# Patient Record
Sex: Female | Born: 1945 | Hispanic: No | Marital: Married | Smoking: Never smoker
Health system: Southern US, Community
[De-identification: ages and names within clinical notes are randomized; demographics above are authoritative.]

## PROBLEM LIST (undated history)

## (undated) HISTORY — PX: CHOLECYSTECTOMY: SHX55

## (undated) HISTORY — PX: KNEE SURGERY: SHX244

## (undated) HISTORY — PX: TUBAL LIGATION: SHX77

---

## 2005-06-01 ENCOUNTER — Ambulatory Visit: Payer: Self-pay | Admitting: Internal Medicine

## 2007-05-11 ENCOUNTER — Ambulatory Visit: Payer: Self-pay | Admitting: Family Medicine

## 2008-04-29 ENCOUNTER — Encounter: Payer: Self-pay | Admitting: Orthopedic Surgery

## 2008-05-12 ENCOUNTER — Encounter: Payer: Self-pay | Admitting: Orthopedic Surgery

## 2008-05-22 ENCOUNTER — Ambulatory Visit: Payer: Self-pay | Admitting: Orthopedic Surgery

## 2008-06-10 ENCOUNTER — Ambulatory Visit: Payer: Self-pay | Admitting: Orthopedic Surgery

## 2009-09-24 ENCOUNTER — Ambulatory Visit: Payer: Self-pay | Admitting: Obstetrics and Gynecology

## 2009-11-06 ENCOUNTER — Ambulatory Visit: Payer: Self-pay | Admitting: Unknown Physician Specialty

## 2011-10-21 ENCOUNTER — Ambulatory Visit: Payer: Self-pay | Admitting: Family Medicine

## 2011-11-10 ENCOUNTER — Ambulatory Visit: Payer: Self-pay | Admitting: Family Medicine

## 2011-11-23 ENCOUNTER — Ambulatory Visit: Payer: Self-pay | Admitting: Obstetrics and Gynecology

## 2012-09-18 IMAGING — MG MM MAMMO DIAGNOSTIC UNILATERAL*R*
1 series · 8 of 8 positions shown · non-contrast
Comparison: none

REASON FOR EXAM: RT BRST PAIN LATERAL ASPECT
COMMENTS:

PROCEDURE:     MAM - MAM DGTL UNI MAM RT BREAST W/CAD  - November 23, 2011  [DATE]
RESULT:
COMPARISON STUDY:    10/21/2011 and 09/24/2009.

[R CC · right · 8 of 11 slices shown]
[im 1/11]
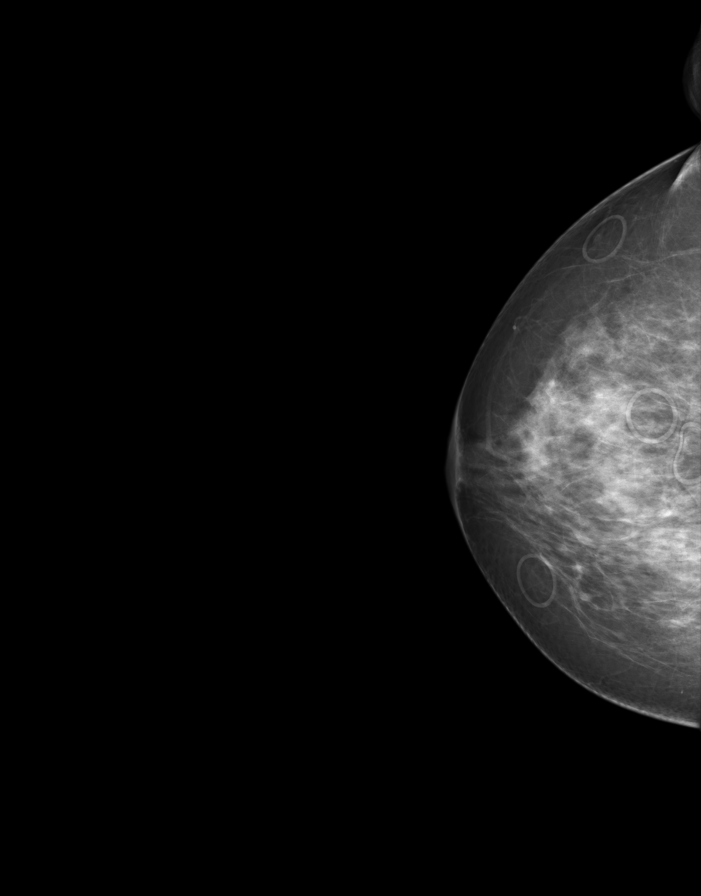
[im 2/11]
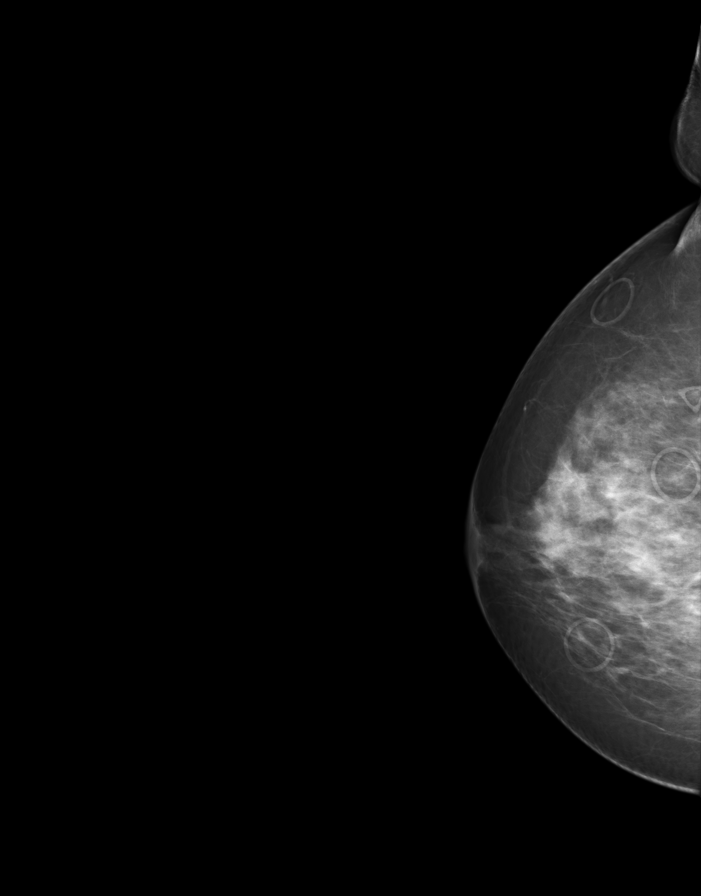
[im 3/11]
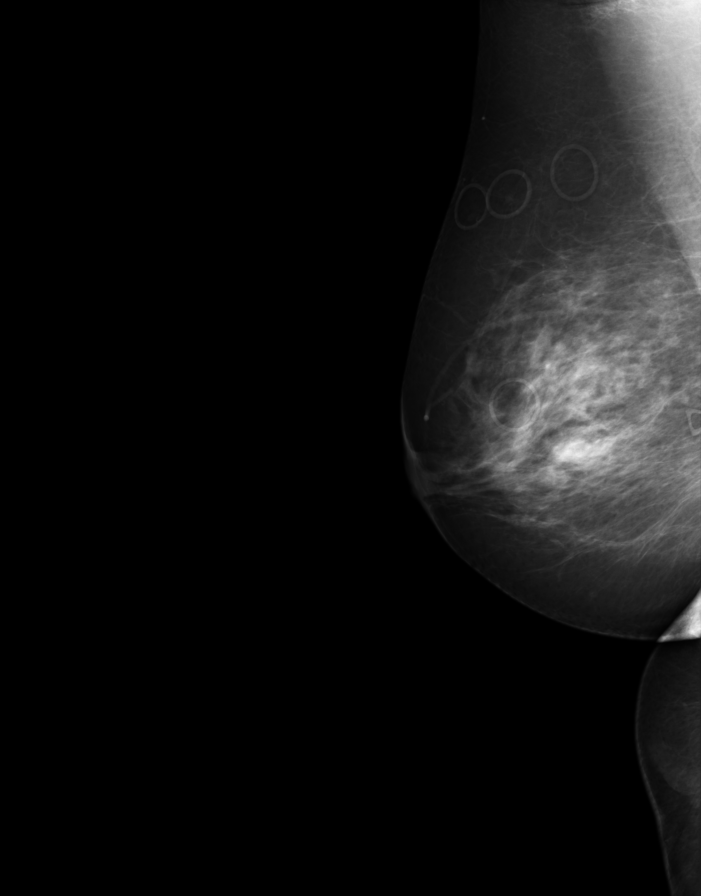
[im 5/11]
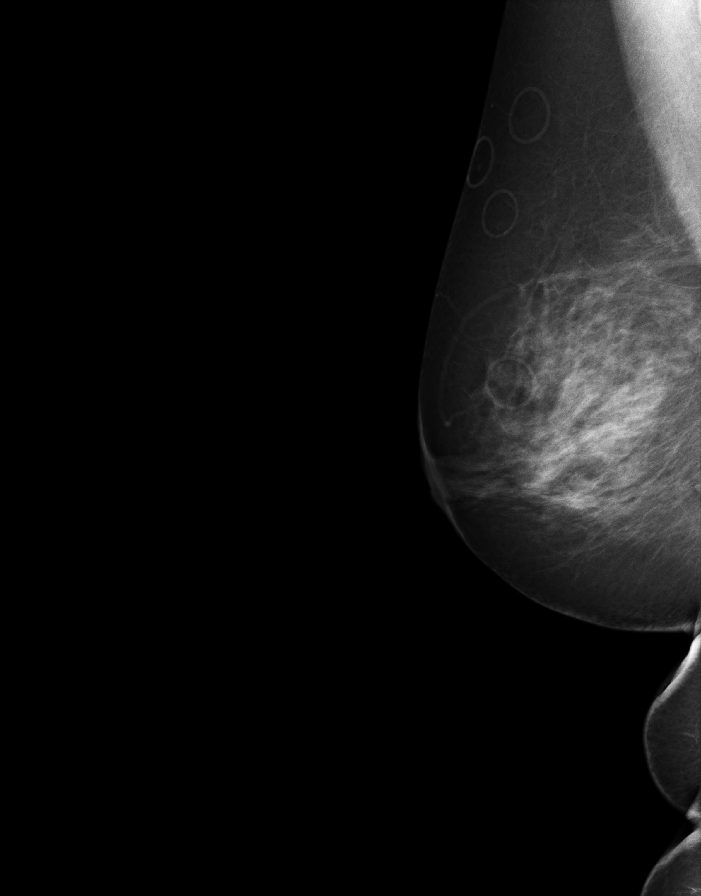
[im 6/11]
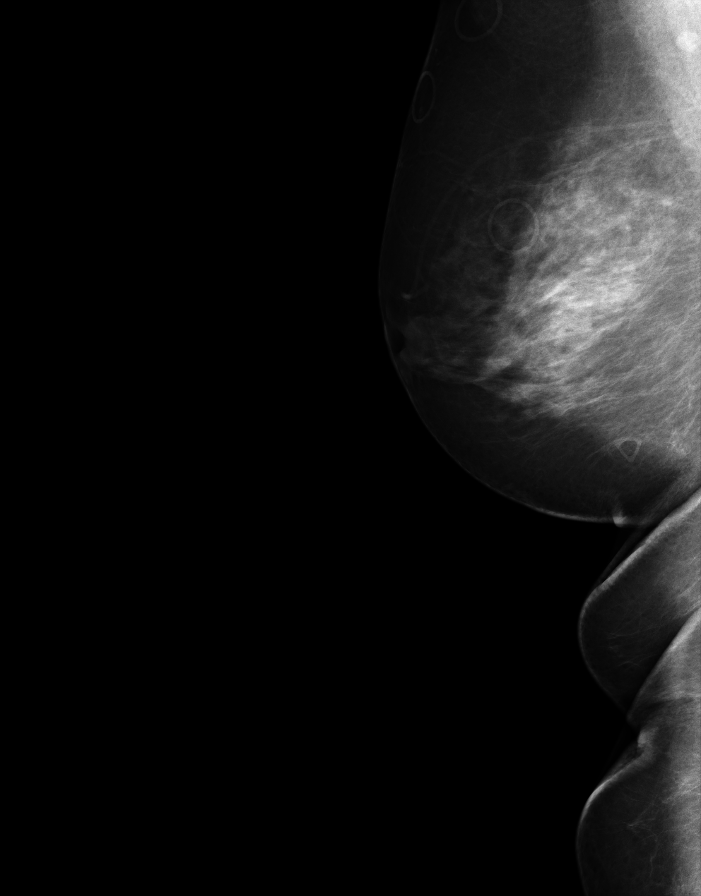
[im 8/11]
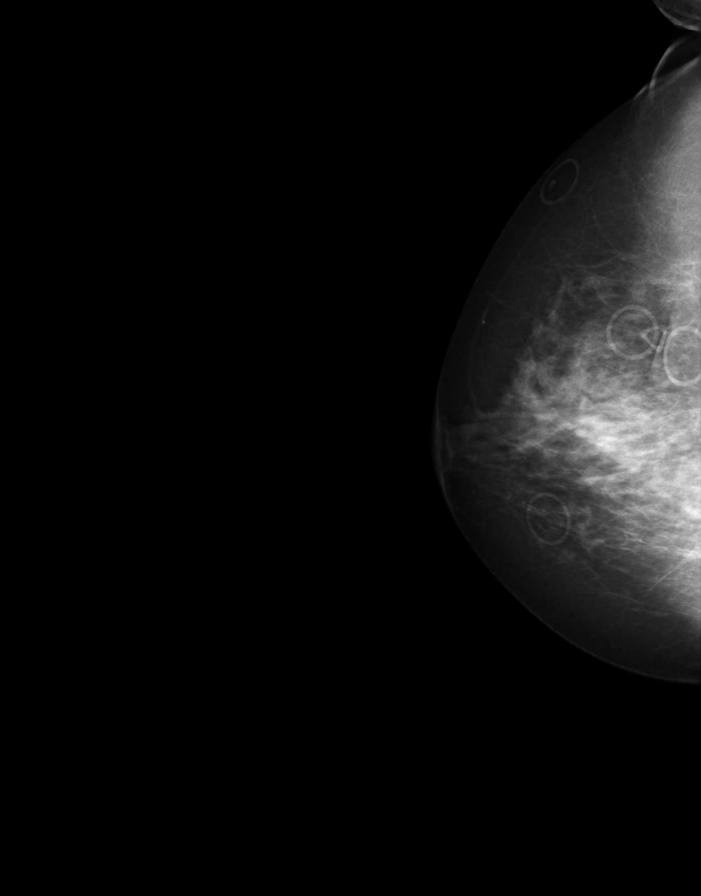
[im 9/11]
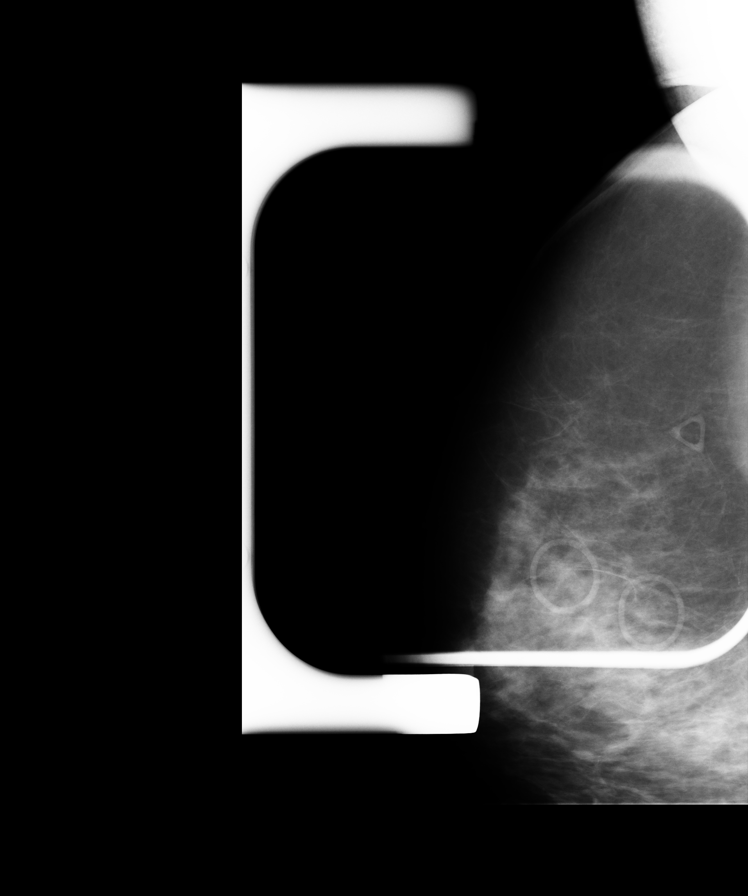
[im 11/11]
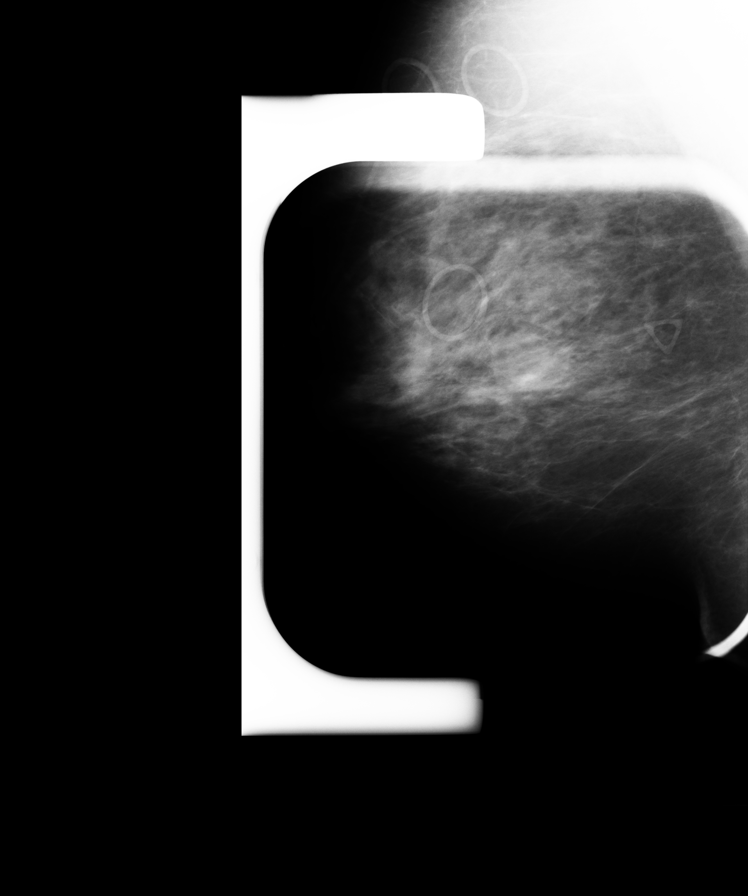

[8 of 8 positions shown; findings below may reference images not displayed]

FINDINGS: Diagnostic mammograms including spot compression magnification
views were performed of the right breast secondary to pain in the lateral
aspect of the right breast. A marker was placed along the inferolateral
right breast at approximately 7-8 o'clock in the region of the patient's
pain directed by the patient. Spot compression magnification views of this
region showed no mass or focal asymmetry. There was a small asymmetry more
anteriorly in the right MLO view. However, with the spot compression
magnification view, this asymmetry effaced and assumed the appearance of
normal glandular tissue. Additionally, no abnormality is seen on the CC or
true lateral views.

Real-time ultrasound was performed of the region of the patient's pain in
the lateral right breast, which was located approximately 7 o'clock. No mass
or suspicious shadowing was identified.
IMPRESSION: 1.     BI-RADS: Category 1-Negative.
2.     Recommend continued annual screening mammography. Recommend further
evaluation and management of the patient's breast pain be based on clinical
grounds.

Thank you for the opportunity to contribute to the care of your patient.

A negative mammogram report does not preclude biopsy or other evaluation of
a clinically palpable or otherwise suspicious mass or lesion.  Breast cancer
may not be detected by mammography in up to 10% of cases.

## 2012-12-13 ENCOUNTER — Ambulatory Visit: Payer: Self-pay | Admitting: Internal Medicine

## 2013-01-16 ENCOUNTER — Ambulatory Visit: Payer: Self-pay | Admitting: Family Medicine

## 2015-05-02 ENCOUNTER — Ambulatory Visit
Admission: EM | Admit: 2015-05-02 | Discharge: 2015-05-02 | Disposition: A | Discharge status: Home / Self Care | Payer: Medicare Other | Attending: Family Medicine | Admitting: Family Medicine

## 2015-05-02 DIAGNOSIS — J111 Influenza due to unidentified influenza virus with other respiratory manifestations: Secondary | ICD-10-CM | POA: Diagnosis not present

## 2015-05-02 LAB — RAPID INFLUENZA A&B ANTIGENS: Influenza A (ARMC): DETECTED

## 2015-05-02 LAB — RAPID INFLUENZA A&B ANTIGENS (ARMC ONLY): INFLUENZA B (ARMC): NOT DETECTED

## 2015-05-02 MED ORDER — MELOXICAM 15 MG PO TABS: 15.0000 mg | ORAL_TABLET | Freq: Every day | ORAL | Status: AC | PRN

## 2015-05-02 MED ORDER — FEXOFENADINE-PSEUDOEPHED ER 180-240 MG PO TB24: 1.0000 | ORAL_TABLET | Freq: Every day | ORAL | Status: AC

## 2015-05-02 MED ORDER — HYDROCOD POLST-CPM POLST ER 10-8 MG/5ML PO SUER: 5.0000 mL | Freq: Two times a day (BID) | ORAL | Status: AC | PRN

## 2015-05-02 MED ORDER — OSELTAMIVIR PHOSPHATE 75 MG PO CAPS: 75.0000 mg | ORAL_CAPSULE | Freq: Two times a day (BID) | ORAL | Status: AC

## 2015-05-02 NOTE — Discharge Instructions (Signed)
Influenza, Adult Influenza (flu) is an infection in the mouth, nose, and throat (respiratory tract) caused by a virus. The flu can make you feel very ill. Influenza spreads easily from person to person (contagious).  HOME CARE   Only take medicines as told by your doctor.  Use a cool mist humidifier to make breathing easier.  Get plenty of rest until your fever goes away. This usually takes 3 to 4 days.  Drink enough fluids to keep your pee (urine) clear or pale yellow.  Cover your mouth and nose when you cough or sneeze.  Wash your hands well to avoid spreading the flu.  Stay home from work or school until your fever has been gone for at least 1 full day.  Get a flu shot every year. GET HELP RIGHT AWAY IF:   You have trouble breathing or feel short of breath.  Your skin or nails turn blue.  You have severe neck pain or stiffness.  You have a severe headache, facial pain, or earache.  Your fever gets worse or keeps coming back.  You feel sick to your stomach (nauseous), throw up (vomit), or have watery poop (diarrhea).  You have chest pain.  You have a deep cough that gets worse, or you cough up more thick spit (mucus). MAKE SURE YOU:   Understand these instructions.  Will watch your condition.  Will get help right away if you are not doing well or get worse.   This information is not intended to replace advice given to you by your health care provider. Make sure you discuss any questions you have with your health care provider.   Document Released: 12/08/2007 Document Revised: 03/21/2014 Document Reviewed: 05/30/2011 Elsevier Interactive Patient Education 2016 [employer]  Cough, Adult A cough helps to clear your throat and lungs. A cough may last only 2-3 weeks (acute), or it may last longer than 8 weeks (chronic). Many different things can cause a cough. A cough may be a sign of an illness or another medical condition. HOME CARE  Pay attention to any changes  in your cough.  Take medicines only as told by your doctor.  If you were prescribed an antibiotic medicine, take it as told by your doctor. Do not stop taking it even if you start to feel better.  Talk with your doctor before you try using a cough medicine.  Drink enough fluid to keep your pee (urine) clear or pale yellow.  If the air is dry, use a cold steam vaporizer or humidifier in your home.  Stay away from things that make you cough at work or at home.  If your cough is worse at night, try using extra pillows to raise your head up higher while you sleep.  Do not smoke, and try not to be around smoke. If you need help quitting, ask your doctor.  Do not have caffeine.  Do not drink alcohol.  Rest as needed. GET HELP IF:  You have new problems (symptoms).  You cough up yellow fluid (pus).  Your cough does not get better after 2-3 weeks, or your cough gets worse.  Medicine does not help your cough and you are not sleeping well.  You have pain that gets worse or pain that is not helped with medicine.  You have a fever.  You are losing weight and you do not know why.  You have night sweats. GET HELP RIGHT AWAY IF:  You cough up blood.  You have trouble  breathing.  Your heartbeat is very fast.   This information is not intended to replace advice given to you by your health care provider. Make sure you discuss any questions you have with your health care provider.   Document Released: 11/11/2010 Document Revised: 11/19/2014 Document Reviewed: 05/07/2014 Elsevier Interactive Patient Education [employer]

## 2015-05-02 NOTE — ED Notes (Signed)
Patient complains of fever, chills, headaches, cough-dry, nasal drainage. Patient states that her symptoms started suddenly on Thursday.

## 2015-05-02 NOTE — ED Provider Notes (Signed)
CSN: [id number]     Arrival date & time 05/02/15  1003 History   First MD Initiated Contact with Patient 05/02/15 1354      Nurses notes were reviewed. Chief Complaint  Patient presents with  . Fever   patient reports coughing started on Thursday. She reports no coughing she's had nasal congestion aching all over sore throat and general malaise. She also reports a fever everything started late Thursday night 5 days for some visible she did not go into work conditions continues to came in to be seen and evaluated. Sore throat has also been a problem as well. She does not smoke she's had a tubal ligation knee surgery cholecystectomy father had bone cancer mother has had congestive heart failure.  Patient did not get her flu shot this past year  (Consider location/radiation/quality/duration/timing/severity/associated sxs/prior Treatment) Patient is a 70 y.o. female presenting with fever. The history is provided by the patient. No language interpreter was used.  Fever Temp source:  Oral Severity:  Moderate Onset quality:  Sudden Timing:  Constant Progression:  Worsening Chronicity:  New Relieved by:  Nothing Worsened by:  Nothing tried Associated symptoms: myalgias and rhinorrhea   Associated symptoms: no chest pain, no chills, no dysuria and no ear pain   Risk factors: no hx of cancer, no immunosuppression, no occupational exposure, no recent surgery, no recent travel and no sick contacts     History reviewed. No pertinent past medical history. Past Surgical History  Procedure Laterality Date  . Tubal ligation    . Knee surgery Right   . Cholecystectomy     Family History  Problem Relation Age of Onset  . Bone cancer Father   . Congestive Heart Failure Mother    Social History  Substance Use Topics  . Smoking status: Never Smoker   . Smokeless tobacco: None  . Alcohol Use: No   OB History    No data available     Review of Systems  Constitutional: Positive for fever.  Negative for chills.  HENT: Positive for rhinorrhea. Negative for ear pain.   Cardiovascular: Negative for chest pain.  Genitourinary: Negative for dysuria.  Musculoskeletal: Positive for myalgias.  All other systems reviewed and are negative.   Allergies  Review of patient's allergies indicates no known allergies.  Home Medications   Prior to Admission medications   Medication Sig Start Date End Date Taking? Authorizing Provider  ranitidine (ZANTAC 150 MAXIMUM STRENGTH) 150 MG tablet Take 150 mg by mouth 2 (two) times daily.   Yes Historical Provider, MD  simvastatin (ZOCOR) 20 MG tablet Take 20 mg by mouth daily.   Yes Historical Provider, MD  chlorpheniramine-HYDROcodone (TUSSIONEX PENNKINETIC ER) 10-8 MG/5ML SUER Take 5 mLs by mouth every 12 (twelve) hours as needed for cough. 05/02/15   Hassan Rowan, MD  fexofenadine-pseudoephedrine (ALLEGRA-D ALLERGY & CONGESTION) 180-240 MG 24 hr tablet Take 1 tablet by mouth daily. 05/02/15   Hassan Rowan, MD  meloxicam (MOBIC) 15 MG tablet Take 1 tablet (15 mg total) by mouth daily as needed for pain. Please take with food and avoid Motrin and Aleve 05/02/15   Hassan Rowan, MD  oseltamivir (TAMIFLU) 75 MG capsule Take 1 capsule (75 mg total) by mouth 2 (two) times daily. 05/02/15   Hassan Rowan, MD   Meds Ordered and Administered this Visit  Medications - No data to display  BP 162/88 mmHg  Pulse 100  Temp(Src) 99.4 F (37.4 C) (Oral)  Resp 17  Ht  (  1.422 m)  Wt 127 lb (57.607 kg)  BMI 28.49 kg/m2  SpO2 100% No data found.   Physical Exam  Constitutional: She is oriented to person, place, and time. She appears well-developed and well-nourished.  HENT:  Head: Normocephalic and atraumatic.  Right Ear: Hearing, tympanic membrane, external ear and ear canal normal.  Left Ear: Hearing, tympanic membrane, external ear and ear canal normal.  Nose: Mucosal edema and rhinorrhea present.  Mouth/Throat: Posterior oropharyngeal erythema present.   Eyes: Conjunctivae are normal. Pupils are equal, round, and reactive to light.  Neck: Normal range of motion.  Cardiovascular: Normal rate, regular rhythm and normal heart sounds.   Pulmonary/Chest: Effort normal.  Musculoskeletal: Normal range of motion.  Neurological: She is alert and oriented to person, place, and time. No cranial nerve deficit.  Skin: Skin is warm and dry.  Psychiatric: She has a normal mood and affect.  Vitals reviewed.   ED Course  Procedures (including critical care time)  Labs Review Labs Reviewed  RAPID INFLUENZA A&B ANTIGENS [facility] ONLY)    Imaging Review No results found.   Visual Acuity Review  Right Eye Distance:   Left Eye Distance:   Bilateral Distance:    Right Eye Near:   Left Eye Near:    Bilateral Near:      Results for orders placed or performed during the hospital encounter of 05/02/15  Rapid Influenza A&B Antigens ([facility] only)  Result Value Ref Range   Influenza A ([facility]) DETECTED    Influenza B ([facility]) NOT DETECTED      MDM   1. Flu    We'll place on Tamiflu 75 mg twice a day, Tussionex 1 teaspoon twice a day and Mobic 15 mg 1 tablet a day and Allegra-D. Follow-up PCP if not better in 5-6 days. Work note given for Saturday Sunday and Monday. If needed.    Hassan Rowan, MD 05/02/15 [id number]
